# Patient Record
Sex: Male | Born: 2000 | Race: White | Hispanic: No | Marital: Single | Smoking: Never smoker
Health system: Southern US, Community
[De-identification: ages and names within clinical notes are randomized; demographics above are authoritative.]

---
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
## 2020-09-18 ENCOUNTER — Emergency Department (HOSPITAL_BASED_OUTPATIENT_CLINIC_OR_DEPARTMENT_OTHER): Payer: Managed Care, Other (non HMO)

## 2020-09-18 ENCOUNTER — Other Ambulatory Visit: Payer: Self-pay

## 2020-09-18 ENCOUNTER — Emergency Department (HOSPITAL_BASED_OUTPATIENT_CLINIC_OR_DEPARTMENT_OTHER)
Admission: EM | Admit: 2020-09-18 | Discharge: 2020-09-18 | Disposition: A | Discharge status: Home / Self Care | Payer: Managed Care, Other (non HMO) | Attending: Emergency Medicine | Admitting: Emergency Medicine

## 2020-09-18 ENCOUNTER — Encounter (HOSPITAL_BASED_OUTPATIENT_CLINIC_OR_DEPARTMENT_OTHER): Payer: Self-pay | Admitting: Obstetrics and Gynecology

## 2020-09-18 DIAGNOSIS — S6991XA Unspecified injury of right wrist, hand and finger(s), initial encounter: Secondary | ICD-10-CM | POA: Diagnosis present

## 2020-09-18 DIAGNOSIS — S60511A Abrasion of right hand, initial encounter: Secondary | ICD-10-CM | POA: Diagnosis not present

## 2020-09-18 DIAGNOSIS — W228XXA Striking against or struck by other objects, initial encounter: Secondary | ICD-10-CM | POA: Diagnosis not present

## 2020-09-18 NOTE — Discharge Instructions (Addendum)
Take Tylenol or Motrin as needed for pain control.  Keep the abrasions clean and dry.  If you continue to have ongoing pain over the next few days, then would recommend follow-up with Ortho.

## 2020-09-18 NOTE — ED Provider Notes (Signed)
[facility] EMERGENCY DEPT Provider Note   CSN: [id number] Arrival date & time: 09/18/20  2032     History Chief Complaint  Patient presents with   Hand Pain    Jason Lucero is a 20 y.o. male.  Presents to ER with concern for hand pain.  Patient reports that he was feeling angry and emotional during an argument at home.  Stepped away from the situation and subsequently punched a door with his right hand.  Has been having some pain to the hand.  Pain worse in his third digit.  No active bleeding.  No medical problems.  No thoughts of hurting himself or hurting others.  Has safe place at home.   HPI     History reviewed. No pertinent past medical history.  There are no problems to display for this patient.   History reviewed. No pertinent surgical history.     No family history on file.  Social History   Tobacco Use   Smoking status: Never   Smokeless tobacco: Never  Substance Use Topics   Alcohol use: No    Home Medications Prior to Admission medications   Medication Sig Start Date End Date Taking? Authorizing Provider  acetaminophen (TYLENOL) 500 MG tablet Take 500 mg by mouth every 6 (six) hours as needed for moderate pain or headache.     [provider]  benzonatate (TESSALON) 100 MG capsule Take 100 mg by mouth 2 (two) times daily as needed for cough.  03/20/19   [provider]  guaiFENesin (ROBITUSSIN) 100 MG/5ML liquid Take 200 mg by mouth 3 (three) times daily as needed for cough.    [provider]  naproxen (NAPROSYN) 375 MG tablet Take 1 tablet (375 mg total) by mouth 2 (two) times daily. 03/31/19   Derwood Kaplan, MD  predniSONE (DELTASONE) 10 MG tablet Take 4 tablets (40 mg total) by mouth daily. 03/31/19   Derwood Kaplan, MD    Allergies    Patient has no known allergies.  Review of Systems   Review of Systems  Constitutional:  Negative for fatigue.  Musculoskeletal:  Positive for arthralgias.  Skin:   Positive for wound.  Psychiatric/Behavioral:  Negative for dysphoric mood and suicidal ideas. The patient is not nervous/anxious.   All other systems reviewed and are negative.  Physical Exam Updated Vital Signs BP 126/81 (BP Location: Right Arm)   Pulse 92   Temp 99.2 F (37.3 C)   Resp 18   Ht 6\' 5"  (1.956 m)   Wt 72.6 kg   SpO2 99%   BMI 18.97 kg/m   Physical Exam Vitals and nursing note reviewed.  Constitutional:      Appearance: He is well-developed.  HENT:     Head: Normocephalic and atraumatic.  Eyes:     Conjunctiva/sclera: Conjunctivae normal.  Cardiovascular:     Rate and Rhythm: Normal rate.     Pulses: Normal pulses.  Pulmonary:     Effort: Pulmonary effort is normal. No respiratory distress.  Abdominal:     Palpations: Abdomen is soft.     Tenderness: There is no abdominal tenderness.  Musculoskeletal:     Cervical back: Neck supple.     Comments: Right hand: There is superficial abrasions over the dorsum of the hand, no active bleeding noted, there is some tenderness to palpation across hand, worse over third carpal bone, no ecchymosis noted; normal sensation, flexion extension intact throughout, normal radial pulse  Skin:    General: Skin is warm  and dry.     Comments: Abrasion as above  Neurological:     Mental Status: He is alert.     Sensory: No sensory deficit.    ED Results / Procedures / Treatments   Labs (all labs ordered are listed, but only abnormal results are displayed) Labs Reviewed - No data to display  EKG None  Radiology DG Hand Complete Right  Result Date: 09/18/2020 CLINICAL DATA:  Punched door.  Right hand pain. EXAM: RIGHT HAND - COMPLETE 3+ VIEW COMPARISON:  None. FINDINGS: There is no evidence of fracture or dislocation. There is no evidence of arthropathy or other focal bone abnormality. Soft tissues are unremarkable. IMPRESSION: Negative. Electronically Signed   By: Danae Orleans M.D.   On: 09/18/2020 21:50     Procedures Procedures   Medications Ordered in ED Medications - No data to display  ED Course  I have reviewed the triage vital signs and the nursing notes.  Pertinent labs & imaging results that were available during my care of the patient were reviewed by me and considered in my medical decision making (see chart for details).    MDM Rules/Calculators/A&P                           20 year old male presents to ER for right hand injury.  Punched a door.  Has some very superficial abrasion to the dorsum of his hand, no active bleeding, no lacerations requiring suture repair.  X-rays negative.  Neurovascular intact.  Discharged home.  After the discussed management above, the patient was determined to be safe for discharge.  The patient was in agreement with this plan and all questions regarding their care were answered.  ED return precautions were discussed and the patient will return to the ED with any significant worsening of condition.  Final Clinical Impression(s) / ED Diagnoses Final diagnoses:  Injury of right hand, initial encounter    Rx / DC Orders ED Discharge Orders     None        Milagros Loll, MD 09/18/20 2315

## 2020-09-18 NOTE — ED Notes (Signed)
Pt verbalizes understanding of discharge instructions. Opportunity for questioning and answers were provided. Armand removed by staff, pt discharged from ED to home. Educated to f/u with Ortho as needed

## 2020-09-18 NOTE — ED Notes (Signed)
Pt denied ice for hand and encouraged to keep abrasions clean and dry.

## 2020-09-18 NOTE — ED Triage Notes (Signed)
Patient reports to the ER for right hand injury after punching a door.

## 2020-10-24 IMAGING — DX DG CHEST 1V PORT
1 series · 2 of 2 positions shown · non-contrast
Comparison: None.

CLINICAL DATA: EPTAE-0O.  Chest pain.

EXAM:
PORTABLE CHEST 1 VIEW

[Series 1: chest ap · 0.14mm/px · 2 of 2 slices shown]
[im 1/2]
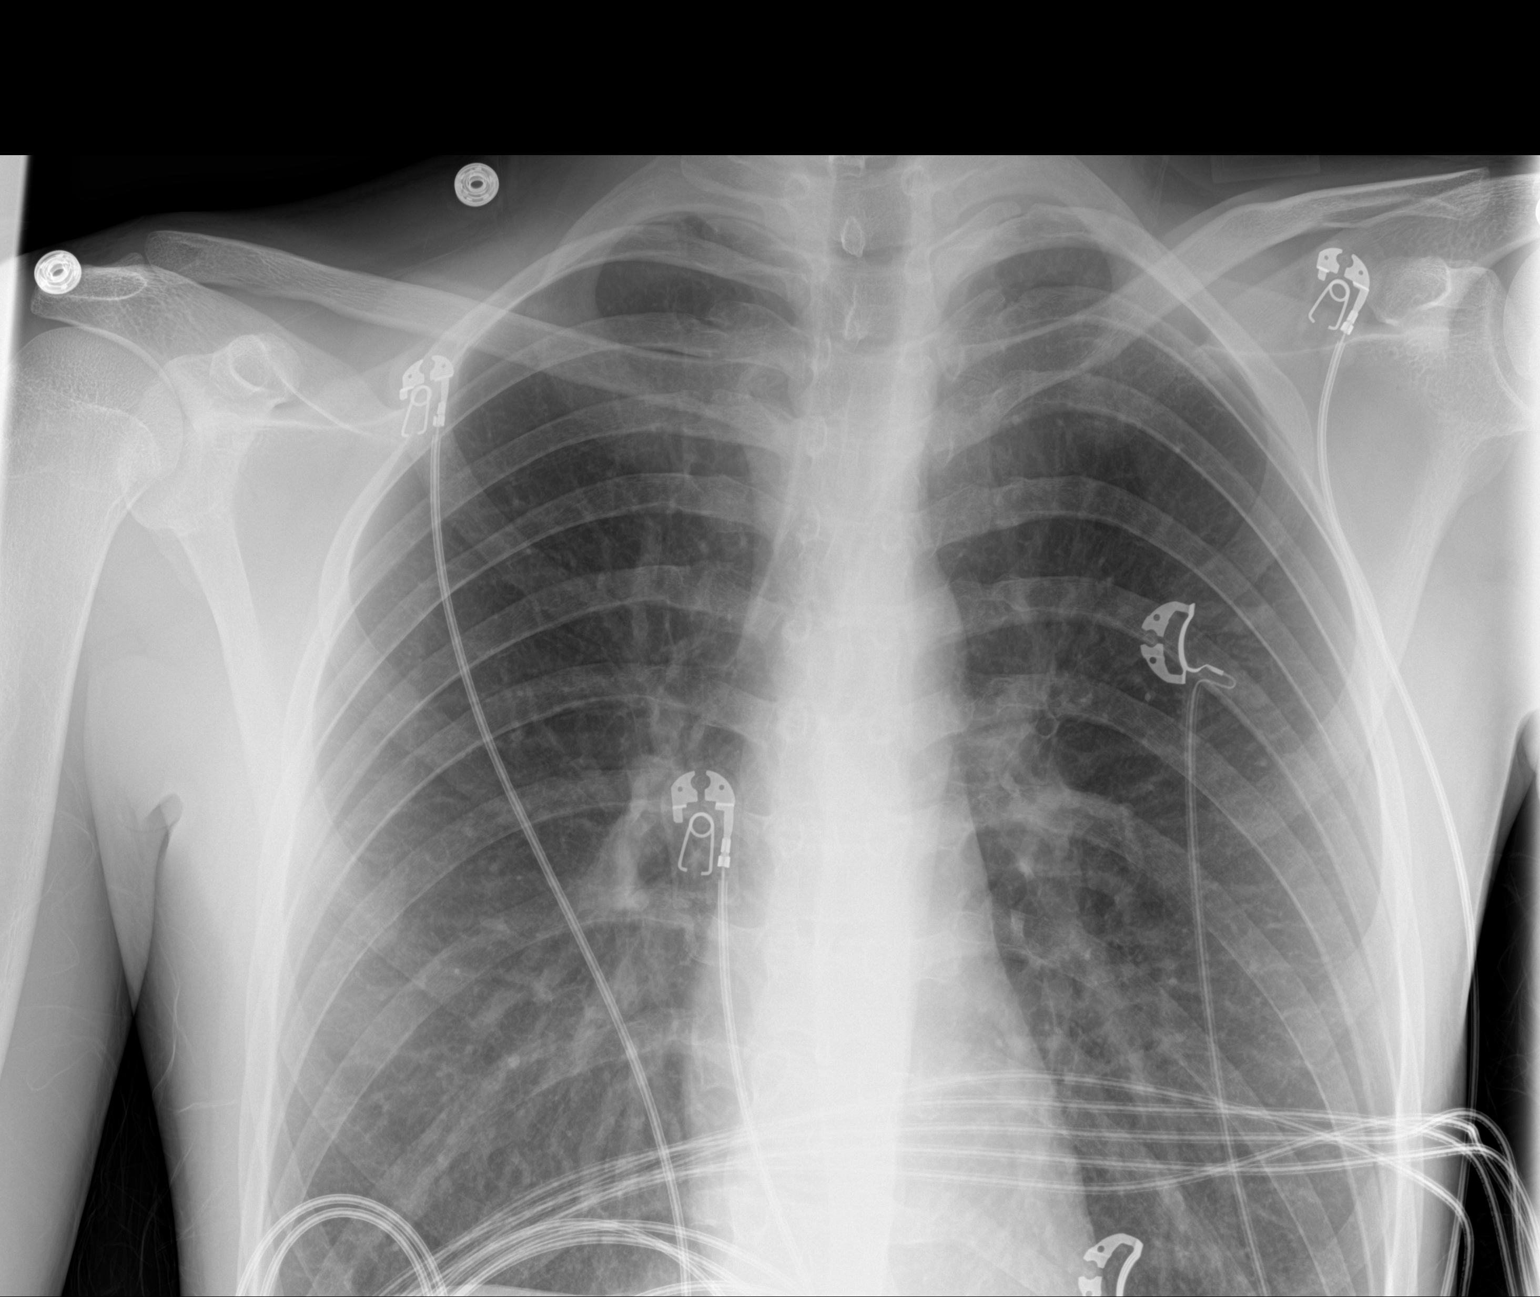
[im 2/2]
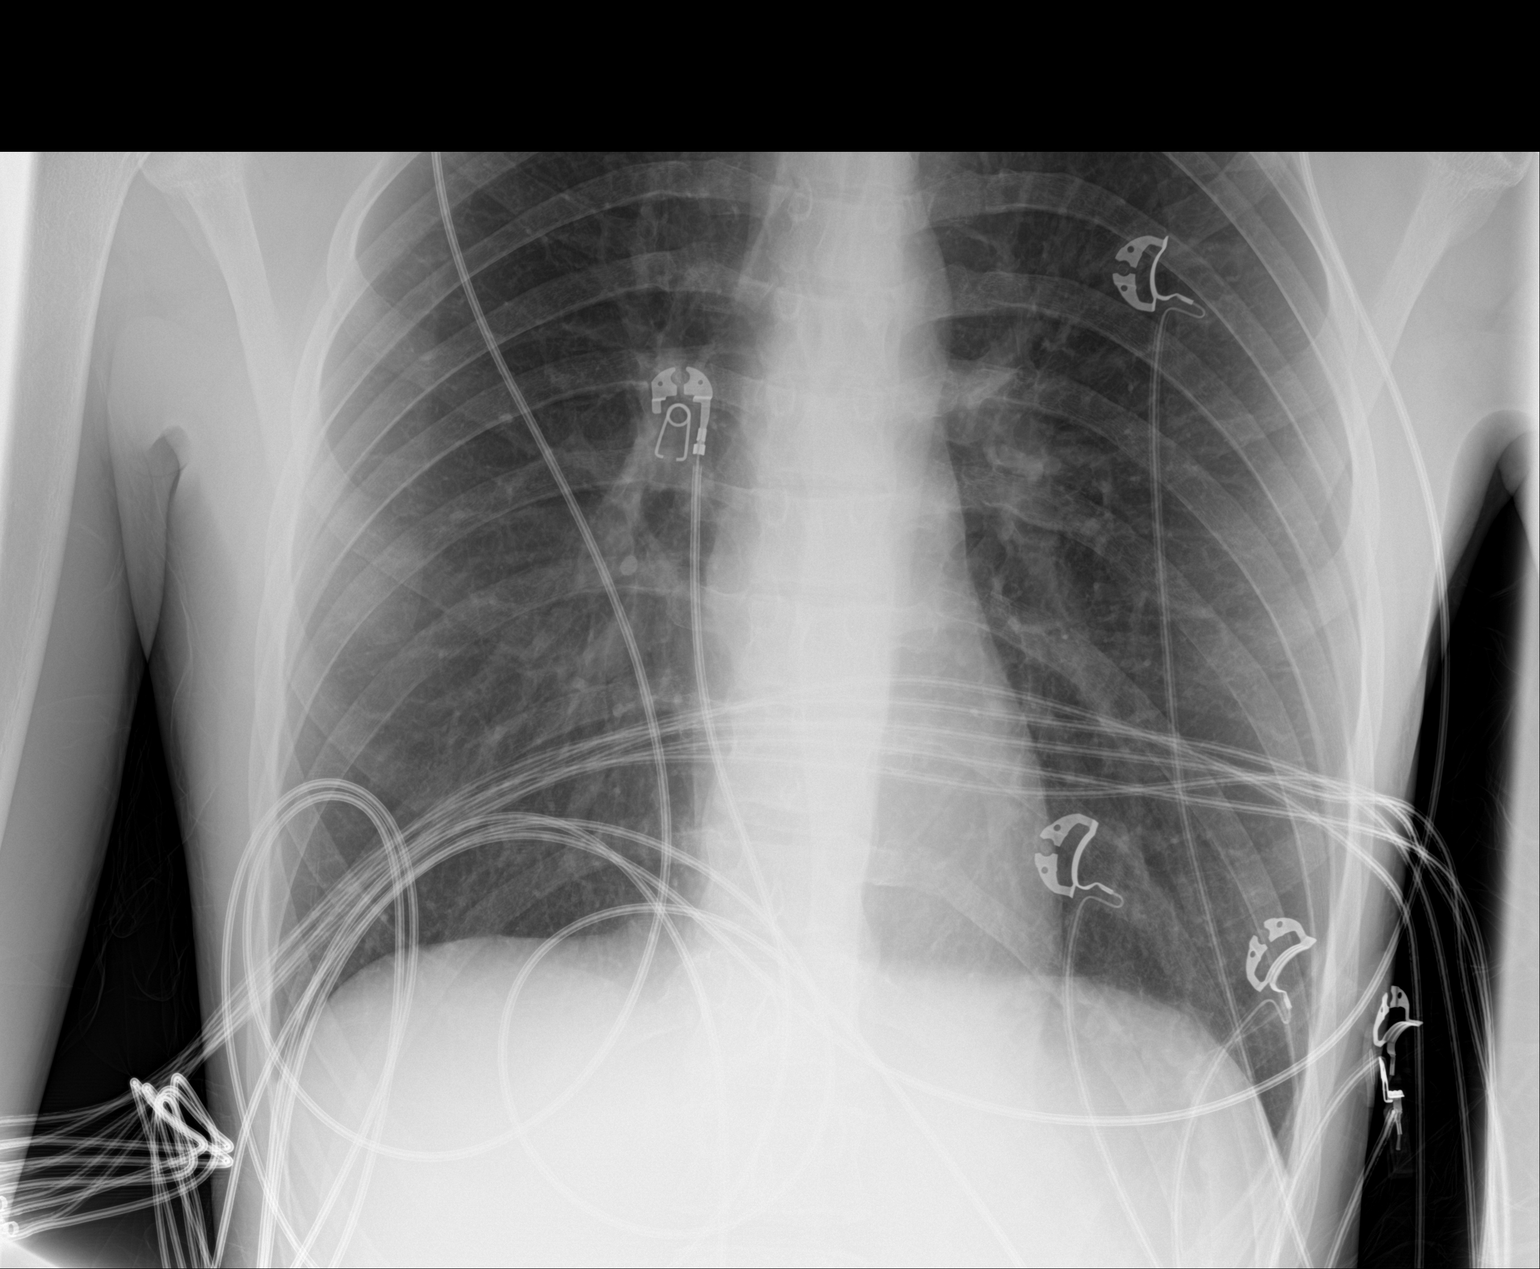

[2 of 2 positions shown; findings below may reference images not displayed]

FINDINGS: The heart size and mediastinal contours are within normal limits.
Both lungs are clear. Deep inspiration. The visualized skeletal
structures are unremarkable.
IMPRESSION: No active disease.

## 2021-02-23 ENCOUNTER — Encounter (HOSPITAL_BASED_OUTPATIENT_CLINIC_OR_DEPARTMENT_OTHER): Payer: Self-pay | Admitting: Obstetrics and Gynecology

## 2021-02-23 ENCOUNTER — Other Ambulatory Visit: Payer: Self-pay

## 2021-02-23 ENCOUNTER — Emergency Department (HOSPITAL_BASED_OUTPATIENT_CLINIC_OR_DEPARTMENT_OTHER)
Admission: EM | Admit: 2021-02-23 | Discharge: 2021-02-23 | Disposition: A | Discharge status: Home / Self Care | Payer: Managed Care, Other (non HMO) | Attending: Emergency Medicine | Admitting: Emergency Medicine

## 2021-02-23 DIAGNOSIS — B2790 Infectious mononucleosis, unspecified without complication: Secondary | ICD-10-CM | POA: Insufficient documentation

## 2021-02-23 DIAGNOSIS — R000 Tachycardia, unspecified: Secondary | ICD-10-CM | POA: Insufficient documentation

## 2021-02-23 DIAGNOSIS — R509 Fever, unspecified: Secondary | ICD-10-CM | POA: Diagnosis present

## 2021-02-23 DIAGNOSIS — R599 Enlarged lymph nodes, unspecified: Secondary | ICD-10-CM | POA: Insufficient documentation

## 2021-02-23 LAB — CBC WITH DIFFERENTIAL/PLATELET
Abs Immature Granulocytes: 0.14 10*3/uL — ABNORMAL HIGH (ref 0.00–0.07)
Basophils Absolute: 0.3 10*3/uL — ABNORMAL HIGH (ref 0.0–0.1)
Basophils Relative: 1 %
Eosinophils Absolute: 0 10*3/uL (ref 0.0–0.5)
Eosinophils Relative: 0 %
HCT: 41.4 % (ref 39.0–52.0)
Hemoglobin: 14.1 g/dL (ref 13.0–17.0)
Immature Granulocytes: 1 %
Lymphocytes Relative: 40 %
Lymphs Abs: 10.2 10*3/uL — ABNORMAL HIGH (ref 0.7–4.0)
MCH: 31.1 pg (ref 26.0–34.0)
MCHC: 34.1 g/dL (ref 30.0–36.0)
MCV: 91.4 fL (ref 80.0–100.0)
Monocytes Absolute: 4.3 10*3/uL — ABNORMAL HIGH (ref 0.1–1.0)
Monocytes Relative: 17 %
Neutro Abs: 10.6 10*3/uL — ABNORMAL HIGH (ref 1.7–7.7)
Neutrophils Relative %: 41 %
Platelets: 171 10*3/uL (ref 150–400)
RBC: 4.53 MIL/uL (ref 4.22–5.81)
RDW: 12.5 % (ref 11.5–15.5)
WBC: 25.5 10*3/uL — ABNORMAL HIGH (ref 4.0–10.5)
nRBC: 0 % (ref 0.0–0.2)

## 2021-02-23 LAB — COMPREHENSIVE METABOLIC PANEL
ALT: 23 U/L (ref 0–44)
AST: 28 U/L (ref 15–41)
Albumin: 4.2 g/dL (ref 3.5–5.0)
Alkaline Phosphatase: 87 U/L (ref 38–126)
Anion gap: 15 (ref 5–15)
BUN: 17 mg/dL (ref 6–20)
CO2: 21 mmol/L — ABNORMAL LOW (ref 22–32)
Calcium: 8.8 mg/dL — ABNORMAL LOW (ref 8.9–10.3)
Chloride: 99 mmol/L (ref 98–111)
Creatinine, Ser: 1.19 mg/dL (ref 0.61–1.24)
GFR, Estimated: >60 mL/min (ref >60)
Glucose, Bld: 101 mg/dL — ABNORMAL HIGH (ref 70–99)
Potassium: 3.8 mmol/L (ref 3.5–5.1)
Sodium: 135 mmol/L (ref 135–145)
Total Bilirubin: 0.9 mg/dL (ref 0.3–1.2)
Total Protein: 7.5 g/dL (ref 6.5–8.1)

## 2021-02-23 LAB — LACTIC ACID, PLASMA
Lactic Acid, Venous: 1.3 mmol/L (ref 0.5–1.9)
Lactic Acid, Venous: 1.7 mmol/L (ref 0.5–1.9)

## 2021-02-23 MED ORDER — IBUPROFEN 600 MG PO TABS: 600.0000 mg | ORAL_TABLET | Freq: Four times a day (QID) | ORAL | 0 refills | Status: AC | PRN

## 2021-02-23 MED ORDER — ONDANSETRON HCL 4 MG/2ML IJ SOLN
4.0000 mg | Freq: Once | INTRAMUSCULAR | Status: AC
  Administered 2021-02-23: 4 mg via INTRAVENOUS
  Filled 2021-02-23: qty 2

## 2021-02-23 MED ORDER — LIDOCAINE VISCOUS HCL 2 % MT SOLN: 15.0000 mL | OROMUCOSAL | 0 refills | Status: DC | PRN

## 2021-02-23 MED ORDER — ONDANSETRON 4 MG PO TBDP: ORAL_TABLET | ORAL | 0 refills | Status: DC

## 2021-02-23 MED ORDER — DEXAMETHASONE SODIUM PHOSPHATE 10 MG/ML IJ SOLN
10.0000 mg | Freq: Once | INTRAMUSCULAR | Status: AC
  Administered 2021-02-23: 10 mg via INTRAVENOUS
  Filled 2021-02-23 (×2): qty 1

## 2021-02-23 MED ORDER — ACETAMINOPHEN 650 MG RE SUPP
650.0000 mg | Freq: Once | RECTAL | Status: AC
  Administered 2021-02-23: 650 mg via RECTAL
  Filled 2021-02-23: qty 1

## 2021-02-23 MED ORDER — SODIUM CHLORIDE 0.9 % IV BOLUS
1000.0000 mL | Freq: Once | INTRAVENOUS | Status: AC
  Administered 2021-02-23: 1000 mL via INTRAVENOUS

## 2021-02-23 MED ORDER — LIDOCAINE VISCOUS HCL 2 % MT SOLN
15.0000 mL | Freq: Once | OROMUCOSAL | Status: AC
  Administered 2021-02-23: 15 mL via OROMUCOSAL
  Filled 2021-02-23: qty 15

## 2021-02-23 NOTE — Discharge Instructions (Signed)
Please take ibuprofen or Tylenol as needed for fever and body aches.  Use viscous lidocaine to help you with your throat irritation.  You may take Zofran as needed for nausea.  Stay hydrated by drinking plenty of fluids and get plenty of rest.  Return if you have any concern.

## 2021-02-23 NOTE — ED Provider Notes (Signed)
[facility] EMERGENCY DEPT Provider Note   CSN: [id number] Arrival date & time: 02/23/21  1140     History  Chief Complaint  Patient presents with   Fever    Jason Lucero is a 21 y.o. male.  The history is provided by the patient and a parent. No language interpreter was used.  Fever  21 year old male recently diagnosed with mono yesterday presenting with complaints of difficulty swallowing.  Home Medications Prior to Admission medications   Medication Sig Start Date End Date Taking? Authorizing Provider  acetaminophen (TYLENOL) 500 MG tablet Take 500 mg by mouth every 6 (six) hours as needed for moderate pain or headache.     [provider]  benzonatate (TESSALON) 100 MG capsule Take 100 mg by mouth 2 (two) times daily as needed for cough.  03/20/19   [provider]  guaiFENesin (ROBITUSSIN) 100 MG/5ML liquid Take 200 mg by mouth 3 (three) times daily as needed for cough.    [provider]  naproxen (NAPROSYN) 375 MG tablet Take 1 tablet (375 mg total) by mouth 2 (two) times daily. 03/31/19   Derwood Kaplan, MD  predniSONE (DELTASONE) 10 MG tablet Take 4 tablets (40 mg total) by mouth daily. 03/31/19   Derwood Kaplan, MD      Allergies    Patient has no known allergies.    Review of Systems   Review of Systems  Constitutional:  Positive for fever.  All other systems reviewed and are negative.  Physical Exam Updated Vital Signs BP 113/70 (BP Location: Left Arm)    Pulse (!) 121    Temp (!) 102.9 F (39.4 C)    Resp 18    Ht 6\' 5"  (1.956 m)    Wt 72.6 kg    SpO2 97%    BMI 18.97 kg/m  Physical Exam Vitals and nursing note reviewed.  Constitutional:      General: He is not in acute distress.    Appearance: He is well-developed.  HENT:     Head: Atraumatic.     Mouth/Throat:     Comments: Uvula midline, bilateral 2+ tonsillar enlargement with exudates, posterior oropharyngeal erythema.  No stridor, no trismus Eyes:      Conjunctiva/sclera: Conjunctivae normal.  Cardiovascular:     Rate and Rhythm: Tachycardia present.  Abdominal:     Comments: No splenomegaly  Musculoskeletal:     Cervical back: Neck supple. No rigidity.  Lymphadenopathy:     Cervical: Cervical adenopathy present.  Skin:    Findings: No rash.  Neurological:     Mental Status: He is alert.    ED Results / Procedures / Treatments   Labs (all labs ordered are listed, but only abnormal results are displayed) Labs Reviewed  COMPREHENSIVE METABOLIC PANEL - Abnormal; Notable for the following components:      Result Value   CO2 21 (*)    Glucose, Bld 101 (*)    Calcium 8.8 (*)    All other components within normal limits  CBC WITH DIFFERENTIAL/PLATELET - Abnormal; Notable for the following components:   WBC 25.5 (*)    Neutro Abs 10.6 (*)    Lymphs Abs 10.2 (*)    Monocytes Absolute 4.3 (*)    Basophils Absolute 0.3 (*)    Abs Immature Granulocytes 0.14 (*)    All other components within normal limits  LACTIC ACID, PLASMA  LACTIC ACID, PLASMA    EKG None  Radiology No results found.  Procedures Procedures  Medications Ordered in ED Medications  acetaminophen (TYLENOL) suppository 650 mg (650 mg Rectal Given 02/23/21 1231)  ondansetron (ZOFRAN) injection 4 mg (4 mg Intravenous Given 02/23/21 1229)  sodium chloride 0.9 % bolus 1,000 mL (0 mLs Intravenous Stopped 02/23/21 1427)  dexamethasone (DECADRON) injection 10 mg (10 mg Intravenous Given 02/23/21 1327)  lidocaine (XYLOCAINE) 2 % viscous mouth solution 15 mL (15 mLs Mouth/Throat Given 02/23/21 1323)    ED Course/ Medical Decision Making/ A&P                           Medical Decision Making  BP 113/70 (BP Location: Left Arm)    Pulse (!) 121    Temp (!) 102.9 F (39.4 C)    Resp 18    Ht 6\' 5"  (1.956 m)    Wt 72.6 kg    SpO2 97%    BMI 18.97 kg/m   1:01 PM This is a generally healthy 21 year old male accompanied by mom to the ER for concerns of recent mono  infection.  I was able to obtain history from patient and through mom who is at bedside.  For the past 4 days patient has had fever chills body aches decreased appetite sore throat and generalized fatigue.  He was seen by his PCP yesterday.  He had strep and mono COVID and flu test which was obtained and he tested positive for mononucleosis.  Last night he appears to be hallucinating and having increased difficulty sleeping.  He is unable to tolerate any p.o. due to his throat irritation.  On exam, patient appears fatigued but nontoxic.  Throat exam demonstrated midline uvula with bilateral tonsillar enlargement with some exudates.  No trismus.  He has reactive lymph nodes in his anterior cervical chain.  Heart and lung sounds normal with exception of tachycardia.  Abdomen is soft nontender no splenomegaly.  Patient does not have any obvious signs of airway compromise on my exam.  Vital signs remarkable for an elevated temperature of 102.9 and tachycardia with heart rate of 121.  He is not hypoxic and blood pressure is normal.  Plan to provide symptomatic treatment which include viscous lidocaine for his sore throat, IV fluid for his dehydration, Decadron to help with inflammation and close monitoring.  Doubt deep tissue infection and at this time I am not concerned for airway compromise.  1:08 PM Since patient does not want to take oral medication at this time due to his discomfort, Tylenol suppository was given.  His discomfort is likely due to irritation and less likely due to structural obstruction  4:15 PM On reassessment patient is doing better.  Labs was obtained and independently reviewed and interpreted by me.  He does have an elevated white count of 25.5 but the remainder of his labs are reassuring.  Normal lactic acid.  His fever did improve with Tylenol.  He tolerates p.o.  Will discharge home with supportive care and return precaution given.  I have considered patient's social determinants of  health in my plan of care.  Patient did not require admission at this time.  Return precaution given.        Final Clinical Impression(s) / ED Diagnoses Final diagnoses:  Infectious mononucleosis without complication, infectious mononucleosis due to unspecified organism    Rx / DC Orders ED Discharge Orders          Ordered    lidocaine (XYLOCAINE) 2 % solution  As needed  02/23/21 1613    ondansetron (ZOFRAN-ODT) 4 MG disintegrating tablet        02/23/21 1613    ibuprofen (ADVIL) 600 MG tablet  Every 6 hours PRN        02/23/21 1613              Fayrene Helperran, Chancey Cullinane, PA-C 02/23/21 1616    Arby BarrettePfeiffer, Marcy, MD 03/03/21 [phone]

## 2021-02-23 NOTE — ED Triage Notes (Signed)
Patient reports to the ER after being diagnosed with Mono yesterday. Patient reports he cannot swallow and is not sleeping. Patient is reportedly suffering from hallucinations and is having body aches.

## 2022-05-14 ENCOUNTER — Ambulatory Visit (INDEPENDENT_AMBULATORY_CARE_PROVIDER_SITE_OTHER): Payer: 59 | Admitting: Podiatry

## 2022-05-14 ENCOUNTER — Ambulatory Visit (INDEPENDENT_AMBULATORY_CARE_PROVIDER_SITE_OTHER): Payer: 59

## 2022-05-14 ENCOUNTER — Encounter: Payer: Self-pay | Admitting: Podiatry

## 2022-05-14 DIAGNOSIS — S92504A Nondisplaced unspecified fracture of right lesser toe(s), initial encounter for closed fracture: Secondary | ICD-10-CM

## 2022-05-14 DIAGNOSIS — M79671 Pain in right foot: Secondary | ICD-10-CM | POA: Diagnosis not present

## 2022-05-14 NOTE — Progress Notes (Signed)
   Chief Complaint  Patient presents with   Fracture    Patient came in today for a right foot fracture, started 05/05/2022, was seen at [facility], [place]: surgical  patient is still having pain, rate of pain 4 out of 10, X-Rays done today     HPI: 22 y.o. male presenting today as a new patient for evaluation of a toe fracture to the right fourth toe.  Patient states that on 05/05/2022 he was hiking and he slipped on a rock and injured his right fourth toe.  He went to the urgent care and was diagnosed with toe fracture and followed up here in the podiatry office.  Currently he is WBAT surgical shoe  No past medical history on file.  No past surgical history on file.  No Known Allergies   Physical Exam: General: The patient is alert and oriented x3 in no acute distress.  Dermatology: Skin is warm, dry and supple bilateral lower extremities. Negative for open lesions or macerations.  Vascular: Palpable pedal pulses bilaterally. Capillary refill within normal limits.  No edema.  There is some ecchymosis localized around the fourth toe right foot  Neurological: Light touch and protective threshold grossly intact  Musculoskeletal Exam: No pedal deformities noted.  There is some tenderness around the fourth toe as well  Radiographic Exam RT foot 05/14/2022:  Normal osseous mineralization.  Subtle irregularity visualized on medial oblique view at the level of the IPJ of the right fourth toe consistent with subtle intra-articular fracture  Assessment: 1.  Fracture right fourth toe, closed, nondisplaced, initial encounter   Plan of Care:  1. Patient evaluated. X-Rays reviewed.  2.  Continue WBAT surgical shoe 3.  Note for work provided today.  No work x 2 weeks until follow-up for reevaluation 4.  Return to clinic 2 weeks for follow-up x-ray and discuss return to work date      Edrick Kins, DPM [facility]  Dr. Edrick Kins, DPM    [address], [place] [address]                Office [phone]  Fax [phone]

## 2022-05-23 ENCOUNTER — Encounter: Payer: Self-pay | Admitting: Podiatry

## 2022-05-23 ENCOUNTER — Ambulatory Visit (INDEPENDENT_AMBULATORY_CARE_PROVIDER_SITE_OTHER): Payer: 59

## 2022-05-23 ENCOUNTER — Ambulatory Visit (INDEPENDENT_AMBULATORY_CARE_PROVIDER_SITE_OTHER): Payer: 59 | Admitting: Podiatry

## 2022-05-23 DIAGNOSIS — S92504A Nondisplaced unspecified fracture of right lesser toe(s), initial encounter for closed fracture: Secondary | ICD-10-CM | POA: Diagnosis not present

## 2022-05-23 NOTE — Progress Notes (Signed)
   Chief Complaint  Patient presents with   Fracture    PATIENT CAME IN TODAY FOR A RIGHT FOOT FRACTURE FOLLOW-UP, PATIENT IS STILL HAVING SOME PAIN RATE OF PAIN 4 OUT OF 10, X-RAYS DONE TODAY     HPI: 22 y.o. male presenting today for follow-up evaluation of a toe fracture to the right fourth toe.  Patient states that he has had in some increased pain and tenderness associated to the toe.  He is concerned that he is not going to be able to return to work as soon as anticipated.  Presenting for follow-up treatment and evaluation  Brief history: Patient states that on 05/05/2022 he was hiking and he slipped on a rock and injured his right fourth toe.  He went to the urgent care and was diagnosed with toe fracture and followed up here in the podiatry office.  Currently he is WBAT surgical shoe  No past medical history on file.  No past surgical history on file.  No Known Allergies   Physical Exam: General: The patient is alert and oriented x3 in no acute distress.  Dermatology: Skin is warm, dry and supple bilateral lower extremities. Negative for open lesions or macerations.  Vascular: Palpable pedal pulses bilaterally. Capillary refill within normal limits.  No edema.  There is some ecchymosis localized around the fourth toe right foot  Neurological: Light touch and protective threshold grossly intact  Musculoskeletal Exam: No pedal deformities noted.  There is some tenderness around the fourth toe as well  Radiographic Exam RT foot 05/14/2022:  Normal osseous mineralization.  Subtle irregularity visualized on medial oblique view at the level of the IPJ of the right fourth toe consistent with subtle intra-articular fracture  Radiographic exam RT foot 05/23/2022: Essentially unchanged from prior x-rays.  Fracture stable.  This should heal uneventfully  Assessment: 1.  Fracture right fourth toe, closed, nondisplaced, subsequent encounter  Plan of Care:  1. Patient evaluated. X-Rays  reviewed and compared to last visit..  2.  Patient continues to have pain and tenderness associated to the toe continue WBAT surgical shoe 3.  Addended note for work provided today.  No work x 4 weeks until follow-up for reevaluation 4.  Return to clinic 2 weeks for follow-up x-ray and discuss return to work date      Edrick Kins, DPM [facility]  Dr. Edrick Kins, DPM    [address], [place] [address]                Office [phone]  Fax [phone]

## 2022-06-01 ENCOUNTER — Other Ambulatory Visit: Payer: Self-pay | Admitting: Podiatry

## 2022-06-01 DIAGNOSIS — S92504A Nondisplaced unspecified fracture of right lesser toe(s), initial encounter for closed fracture: Secondary | ICD-10-CM

## 2022-06-11 ENCOUNTER — Encounter: Payer: Self-pay | Admitting: Podiatry

## 2022-06-14 ENCOUNTER — Encounter: Payer: Self-pay | Admitting: Podiatry

## 2022-06-20 ENCOUNTER — Ambulatory Visit: Payer: 59 | Admitting: Podiatry

## 2024-03-12 ENCOUNTER — Emergency Department (HOSPITAL_BASED_OUTPATIENT_CLINIC_OR_DEPARTMENT_OTHER)
Admission: EM | Admit: 2024-03-12 | Discharge: 2024-03-13 | Disposition: A | Discharge status: Home / Self Care | Attending: Emergency Medicine | Admitting: Emergency Medicine

## 2024-03-12 ENCOUNTER — Other Ambulatory Visit: Payer: Self-pay

## 2024-03-12 ENCOUNTER — Encounter (HOSPITAL_BASED_OUTPATIENT_CLINIC_OR_DEPARTMENT_OTHER): Payer: Self-pay | Admitting: Emergency Medicine

## 2024-03-12 DIAGNOSIS — J069 Acute upper respiratory infection, unspecified: Secondary | ICD-10-CM | POA: Insufficient documentation

## 2024-03-12 DIAGNOSIS — R509 Fever, unspecified: Secondary | ICD-10-CM | POA: Diagnosis present

## 2024-03-12 DIAGNOSIS — J029 Acute pharyngitis, unspecified: Secondary | ICD-10-CM

## 2024-03-12 LAB — RESP PANEL BY RT-PCR (RSV, FLU A&B, COVID)  RVPGX2
Influenza A by PCR: NEGATIVE
Influenza B by PCR: NEGATIVE
Resp Syncytial Virus by PCR: NEGATIVE
SARS Coronavirus 2 by RT PCR: NEGATIVE

## 2024-03-12 LAB — GROUP A STREP BY PCR: Group A Strep by PCR: NOT DETECTED

## 2024-03-12 MED ORDER — LIDOCAINE VISCOUS HCL 2 % MT SOLN: 5.0000 mL | Freq: Three times a day (TID) | OROMUCOSAL | 0 refills | Status: AC | PRN

## 2024-03-12 MED ORDER — ACETAMINOPHEN 325 MG PO TABS
650.0000 mg | ORAL_TABLET | Freq: Once | ORAL | Status: AC
  Administered 2024-03-12: 650 mg via ORAL
  Filled 2024-03-12: qty 2

## 2024-03-12 MED ORDER — ONDANSETRON HCL 4 MG PO TABS: 4.0000 mg | ORAL_TABLET | Freq: Four times a day (QID) | ORAL | 0 refills | Status: AC

## 2024-03-12 MED ORDER — ONDANSETRON 4 MG PO TBDP
4.0000 mg | ORAL_TABLET | Freq: Once | ORAL | Status: AC
  Administered 2024-03-12: 4 mg via ORAL
  Filled 2024-03-12: qty 1

## 2024-03-12 MED ORDER — IBUPROFEN 800 MG PO TABS
800.0000 mg | ORAL_TABLET | Freq: Once | ORAL | Status: AC
  Administered 2024-03-13: 800 mg via ORAL
  Filled 2024-03-12: qty 1

## 2024-03-12 NOTE — ED Provider Notes (Signed)
 " [place] EMERGENCY DEPARTMENT AT [facility] Provider Note   CSN: [id number] Arrival date & time: 03/12/24  2137     Patient presents with: Influenza   Jason Lucero is a 24 y.o. male.   Patient is here for evaluation of fevers, body aches, cough, congestion, and fatigue that began earlier today.  Patient is accompanied by his mother.  Patient took DayQuil at about 3 PM today.  No known exposures though the patient does go to school and his mother works in childcare.  No history of asthma.  Denies chest pain, shortness of breath, dizziness, syncope.  He denies dysuria, hematuria, constipation, or vomiting.  He does report nausea at this time as well as decreased appetite today.  The history is provided by the patient and a parent.  Influenza Presenting symptoms: cough, fever, nausea and sore throat        Prior to Admission medications  Medication Sig Start Date End Date Taking? Authorizing Provider  magic mouthwash (lidocaine , diphenhydrAMINE, alum & mag hydroxide) suspension Swish and swallow 5 mLs 3 (three) times daily as needed for mouth pain. 03/12/24  Yes Rosina Almarie LABOR, PA-C  ondansetron  (ZOFRAN ) 4 MG tablet Take 1 tablet (4 mg total) by mouth every 6 (six) hours. 03/12/24  Yes Rosina Almarie LABOR, PA-C  ibuprofen  (ADVIL ) 600 MG tablet Take 1 tablet (600 mg total) by mouth every 6 (six) hours as needed. 02/23/21   Nivia Colon, PA-C  naproxen  (NAPROSYN ) 375 MG tablet Take 1 tablet (375 mg total) by mouth 2 (two) times daily. Patient not taking: Reported on 02/23/2021 03/31/19   Nanavati, Ankit, MD  predniSONE  (DELTASONE ) 10 MG tablet Take 4 tablets (40 mg total) by mouth daily. Patient not taking: Reported on 02/23/2021 03/31/19   Nanavati, Ankit, MD    Allergies: Patient has no known allergies.    Review of Systems  Constitutional:  Positive for fever.  HENT:  Positive for sore throat.   Respiratory:  Positive for cough.   Gastrointestinal:  Positive for nausea.     Vitals:   03/12/24 2146 03/12/24 2147 03/12/24 2345  BP:  134/74 (!) 114/45  Pulse:  (!) 157 (!) 102  Resp:  (!) 22 20  Temp:  (!) 103.1 F (39.5 C) (!) 102.6 F (39.2 C)  TempSrc:  Oral Oral  SpO2:  99% 96%  Weight: 79.4 kg      Updated Vital Signs BP (!) 114/45 (BP Location: Right Arm)   Pulse (!) 102   Temp (!) 102.6 F (39.2 C) (Oral)   Resp 20   Wt 79.4 kg   SpO2 96%   BMI 20.75 kg/m   Physical Exam Vitals and nursing note reviewed.  Constitutional:      Appearance: Normal appearance. He is normal weight. He is ill-appearing. He is not toxic-appearing or diaphoretic.  HENT:     Head: Normocephalic and atraumatic.     Right Ear: Tympanic membrane, ear canal and external ear normal. There is no impacted cerumen.     Left Ear: Tympanic membrane, ear canal and external ear normal. There is no impacted cerumen.     Nose: Congestion and rhinorrhea present.     Mouth/Throat:     Mouth: Mucous membranes are moist.     Pharynx: Posterior oropharyngeal erythema present. No oropharyngeal exudate.     Comments: Tonsils are swollen without exudate. No stridor or trismus. Eyes:     General: No scleral icterus.       Right  eye: No discharge.        Left eye: No discharge.     Extraocular Movements: Extraocular movements intact.     Conjunctiva/sclera: Conjunctivae normal.     Pupils: Pupils are equal, round, and reactive to light.  Cardiovascular:     Rate and Rhythm: Regular rhythm. Tachycardia present.     Pulses: Normal pulses.     Heart sounds: Normal heart sounds.  Pulmonary:     Effort: Pulmonary effort is normal. No respiratory distress.     Breath sounds: Normal breath sounds. No stridor. No wheezing, rhonchi or rales.  Abdominal:     General: Abdomen is flat. Bowel sounds are normal. There is no distension.     Palpations: Abdomen is soft.     Tenderness: There is no abdominal tenderness. There is no right CVA tenderness, left CVA tenderness or guarding.      Comments: No splenomegaly  Musculoskeletal:        General: No tenderness. Normal range of motion.     Cervical back: Normal range of motion. No rigidity.     Right lower leg: No edema.     Left lower leg: No edema.  Lymphadenopathy:     Cervical: Cervical adenopathy (R) present.  Skin:    General: Skin is warm and dry.     Capillary Refill: Capillary refill takes less than 2 seconds.     Coloration: Skin is not jaundiced or pale.  Neurological:     Mental Status: He is alert and oriented to person, place, and time.     (all labs ordered are listed, but only abnormal results are displayed) Labs Reviewed  GROUP A STREP BY PCR  RESP PANEL BY RT-PCR (RSV, FLU A&B, COVID)  [id number]    EKG: None  Radiology: No results found.   Procedures   Medications Ordered in the ED  ibuprofen  (ADVIL ) tablet 800 mg (has no administration in time range)  acetaminophen  (TYLENOL ) tablet 650 mg (650 mg Oral Given 03/12/24 2150)  ondansetron  (ZOFRAN -ODT) disintegrating tablet 4 mg (4 mg Oral Given 03/12/24 2346)    Clinical Course as of 03/12/24 2356  Thu Mar 12, 2024  2258 Resp panel by RT-PCR (RSV, Flu A&B, Covid) Throat [EA]    Clinical Course User Index [EA] Rosina Almarie LABOR, PA-C    Patient presents to the ED for concern of flulike symptoms, this involves an extensive number of treatment options, and is a complaint that carries with it a high risk of complications and morbidity.  The differential diagnosis includes viral URI, influenza, COVID, RSV, pneumonia, mononucleosis.    Additional history obtained:  Additional history obtained from  [facility]   External records from outside source obtained and reviewed including additional history provided by his mother at bedside.   Lab Tests:  I Ordered, and personally interpreted labs.  The pertinent results include:  Respiratory panel and strep testing negative.    Medicines ordered and prescription drug management:  I ordered  medication including tylenol , ibuprofen , and zofran   for fever, body aches, and nausea  Reevaluation of the patient after these medicines showed that the patient improved I have reviewed the patients home medicines and have made adjustments as needed   Problem List / ED Course:  Clinical Course as of 03/12/24 2356  Thu Mar 12, 2024  2258 Resp panel by RT-PCR (RSV, Flu A&B, Covid) Throat [EA]    Clinical Course User Index [EA] Rosina Almarie A, PA-C    Viral URI. Physical  exam, vitals, and history are consistent with viral URI with no acute or concerning findings.  Respiratory panel and strep testing negative. Temperature, heart rate, and symptoms improved with tylenol ; patient still fevering so ibuprofen  given prior to discharge. Spoke with patient and his mother about diagnosis, outpatient treatment, return precautions and they verbalized understanding.  Stable for discharge.   Reevaluation:  After the interventions noted above, I reevaluated the patient and found that they have :improved   Dispostion:  After consideration of the diagnostic results and the patients response to treatment, I feel that the patent would benefit from supportive care in the home setting and symptom management using over-the-counter medications with close follow-up in primary care for evaluation of improvement of symptoms. I have sent antiemetic and cough syrup to pharmacy for symptom management. Return precautions given.                                 Medical Decision Making Amount and/or Complexity of Data Reviewed Labs:  Decision-making details documented in ED Course.  Risk OTC drugs.   This note was produced using [employer]. While the provider has reviewed and verified all clinical information, transcription errors may remain.    Final diagnoses:  Viral URI with cough  Sore throat    ED Discharge Orders          Ordered    ondansetron  (ZOFRAN ) 4 MG tablet   Every 6 hours        03/12/24 2341    magic mouthwash (lidocaine , diphenhydrAMINE, alum & mag hydroxide) suspension  3 times daily PRN        03/12/24 2341               Rosina Almarie LABOR, PA-C 03/12/24 2357    Armenta Canning, MD 03/13/24 0008  "

## 2024-03-12 NOTE — Discharge Instructions (Addendum)
 It was a pleasure meeting with you today. As we discussed you tested negative for covid, flu a&b, rsv, and strep throat today. Symptoms are most likely secondary to a viral upper respiratory infection, see below for treatment recommendations. I have sent prescription anti-nausea medication and cough syrup to your pharmacy. When taking this cough syrup do not drink alcohol or operate heavy machinery as it can make you feel drowsy.  Read the instructions below on reasons to return to the emergency department and to learn more about your diagnosis.  Use over the counter medications for symptomatic relief as we discussed (musinex as a decongestant, Tylenol  for fever/pain, Motrin /Ibuprofen  for muscle aches). Followup with your primary care doctor in 4 days if your symptoms persist.  You're more than welcome to return to the emergency department if symptoms worsen or become concerning.  Upper Respiratory Infection, Adult  An upper respiratory infection (URI) is also sometimes known as the common cold. Most people improve within 1 week, but symptoms can last up to 2 weeks. A residual cough may last even longer.   URI is most commonly caused by a virus. Viruses are NOT treated with antibiotics. You can easily spread the virus to others by oral contact. This includes kissing, sharing a glass, coughing, or sneezing. Touching your mouth or nose and then touching a surface, which is then touched by another person, can also spread the virus.   TREATMENT  Treatment is directed at relieving symptoms. There is no cure. Antibiotics are not effective, because the infection is caused by a virus, not by bacteria. Treatment may include:  Increased fluid intake. Sports drinks offer valuable electrolytes, sugars, and fluids.  Breathing heated mist or steam (vaporizer or shower).  Eating chicken soup or other clear broths, and maintaining good nutrition.  Getting plenty of rest.  Using gargles or lozenges for comfort.   Controlling fevers with ibuprofen  or acetaminophen  as directed by your caregiver.  Increasing usage of your inhaler if you have asthma.  Return to work when your temperature has returned to normal.   SEEK MEDICAL CARE IF:  After the first few days, you feel you are getting worse rather than better.  You develop worsening shortness of breath, or brown or red sputum. These may be signs of pneumonia.  You develop yellow or brown nasal discharge or pain in the face, especially when you bend forward. These may be signs of sinusitis.  You develop a fever, swollen neck glands, pain with swallowing, or white areas in the back of your throat. These may be signs of strep throat.

## 2024-03-12 NOTE — ED Triage Notes (Addendum)
 Patient c/o fever, chills, sore throat, runny nose, body aches, and headache that started today. Patient took dayquil with tylenol  in at 1500.
